# Patient Record
Sex: Female | Born: 1955 | Race: White | Hispanic: No | Marital: Married | Smoking: Never smoker
Health system: Southern US, Community
[De-identification: ages and names within clinical notes are randomized; demographics above are authoritative.]

## PROBLEM LIST (undated history)

## (undated) DIAGNOSIS — N939 Abnormal uterine and vaginal bleeding, unspecified: Secondary | ICD-10-CM

## (undated) DIAGNOSIS — D219 Benign neoplasm of connective and other soft tissue, unspecified: Secondary | ICD-10-CM

## (undated) DIAGNOSIS — M1990 Unspecified osteoarthritis, unspecified site: Secondary | ICD-10-CM

## (undated) DIAGNOSIS — K219 Gastro-esophageal reflux disease without esophagitis: Secondary | ICD-10-CM

## (undated) DIAGNOSIS — T7840XA Allergy, unspecified, initial encounter: Secondary | ICD-10-CM

## (undated) DIAGNOSIS — M8580 Other specified disorders of bone density and structure, unspecified site: Secondary | ICD-10-CM

## (undated) DIAGNOSIS — Z780 Asymptomatic menopausal state: Secondary | ICD-10-CM

## (undated) DIAGNOSIS — R002 Palpitations: Secondary | ICD-10-CM

## (undated) HISTORY — PX: WISDOM TOOTH EXTRACTION: SHX21

## (undated) HISTORY — DX: Asymptomatic menopausal state: Z78.0

## (undated) HISTORY — DX: Unspecified osteoarthritis, unspecified site: M19.90

## (undated) HISTORY — DX: Gastro-esophageal reflux disease without esophagitis: K21.9

## (undated) HISTORY — PX: AUGMENTATION MAMMAPLASTY: SUR837

## (undated) HISTORY — DX: Palpitations: R00.2

## (undated) HISTORY — DX: Other specified disorders of bone density and structure, unspecified site: M85.80

## (undated) HISTORY — DX: Abnormal uterine and vaginal bleeding, unspecified: N93.9

## (undated) HISTORY — PX: POLYPECTOMY: SHX149

## (undated) HISTORY — DX: Benign neoplasm of connective and other soft tissue, unspecified: D21.9

## (undated) HISTORY — PX: BREAST ENHANCEMENT SURGERY: SHX7

## (undated) HISTORY — DX: Allergy, unspecified, initial encounter: T78.40XA

---

## 2003-11-26 ENCOUNTER — Encounter: Admission: RE | Admit: 2003-11-26 | Discharge: 2003-11-26 | Payer: Self-pay | Admitting: Neurology

## 2005-04-03 ENCOUNTER — Other Ambulatory Visit: Admission: RE | Admit: 2005-04-03 | Discharge: 2005-04-03 | Payer: Self-pay | Admitting: Gynecology

## 2006-04-27 ENCOUNTER — Other Ambulatory Visit: Admission: RE | Admit: 2006-04-27 | Discharge: 2006-04-27 | Payer: Self-pay | Admitting: Gynecology

## 2008-12-22 ENCOUNTER — Ambulatory Visit: Payer: Self-pay | Admitting: Gastroenterology

## 2009-01-05 ENCOUNTER — Encounter: Payer: Self-pay | Admitting: Gastroenterology

## 2009-01-05 ENCOUNTER — Ambulatory Visit: Payer: Self-pay | Admitting: Gastroenterology

## 2009-01-09 ENCOUNTER — Encounter: Payer: Self-pay | Admitting: Gastroenterology

## 2009-07-09 ENCOUNTER — Encounter: Admission: RE | Admit: 2009-07-09 | Discharge: 2009-07-09 | Payer: Self-pay | Admitting: Gynecology

## 2010-10-02 ENCOUNTER — Encounter
Admission: RE | Admit: 2010-10-02 | Discharge: 2010-10-02 | Payer: Self-pay | Source: Home / Self Care | Attending: Gynecology | Admitting: Gynecology

## 2010-10-16 ENCOUNTER — Other Ambulatory Visit: Payer: Self-pay | Admitting: Gynecology

## 2011-05-06 ENCOUNTER — Other Ambulatory Visit: Payer: Self-pay | Admitting: Gynecology

## 2011-05-06 DIAGNOSIS — N63 Unspecified lump in unspecified breast: Secondary | ICD-10-CM

## 2011-05-09 ENCOUNTER — Ambulatory Visit
Admission: RE | Admit: 2011-05-09 | Discharge: 2011-05-09 | Disposition: A | Discharge status: Home / Self Care | Payer: Managed Care, Other (non HMO) | Source: Ambulatory Visit | Attending: Gynecology | Admitting: Gynecology

## 2011-05-09 ENCOUNTER — Other Ambulatory Visit: Payer: Self-pay | Admitting: Gynecology

## 2011-05-09 DIAGNOSIS — N63 Unspecified lump in unspecified breast: Secondary | ICD-10-CM

## 2011-10-29 ENCOUNTER — Other Ambulatory Visit: Payer: Self-pay | Admitting: Gynecology

## 2011-10-29 DIAGNOSIS — Z1231 Encounter for screening mammogram for malignant neoplasm of breast: Secondary | ICD-10-CM

## 2011-11-06 ENCOUNTER — Ambulatory Visit
Admission: RE | Admit: 2011-11-06 | Discharge: 2011-11-06 | Disposition: A | Discharge status: Home / Self Care | Payer: Managed Care, Other (non HMO) | Source: Ambulatory Visit | Attending: Gynecology | Admitting: Gynecology

## 2011-11-06 ENCOUNTER — Other Ambulatory Visit: Payer: Self-pay | Admitting: Gynecology

## 2011-11-06 DIAGNOSIS — Z1231 Encounter for screening mammogram for malignant neoplasm of breast: Secondary | ICD-10-CM

## 2013-05-25 ENCOUNTER — Other Ambulatory Visit: Payer: Self-pay

## 2013-05-25 DIAGNOSIS — Z1231 Encounter for screening mammogram for malignant neoplasm of breast: Secondary | ICD-10-CM

## 2013-05-25 DIAGNOSIS — Z9882 Breast implant status: Secondary | ICD-10-CM

## 2013-07-14 ENCOUNTER — Ambulatory Visit
Admission: RE | Admit: 2013-07-14 | Discharge: 2013-07-14 | Disposition: A | Discharge status: Home / Self Care | Payer: 59 | Source: Ambulatory Visit

## 2013-07-14 DIAGNOSIS — Z1231 Encounter for screening mammogram for malignant neoplasm of breast: Secondary | ICD-10-CM

## 2013-07-14 DIAGNOSIS — Z9882 Breast implant status: Secondary | ICD-10-CM

## 2013-07-18 ENCOUNTER — Other Ambulatory Visit: Payer: Self-pay | Admitting: Gynecology

## 2013-07-18 DIAGNOSIS — R928 Other abnormal and inconclusive findings on diagnostic imaging of breast: Secondary | ICD-10-CM

## 2013-07-22 ENCOUNTER — Ambulatory Visit
Admission: RE | Admit: 2013-07-22 | Discharge: 2013-07-22 | Disposition: A | Discharge status: Home / Self Care | Payer: 59 | Source: Ambulatory Visit | Attending: Gynecology | Admitting: Gynecology

## 2013-07-22 DIAGNOSIS — R928 Other abnormal and inconclusive findings on diagnostic imaging of breast: Secondary | ICD-10-CM

## 2013-10-26 ENCOUNTER — Encounter: Payer: Self-pay | Admitting: Gastroenterology

## 2014-01-11 ENCOUNTER — Other Ambulatory Visit: Payer: Self-pay | Admitting: Family Medicine

## 2014-01-11 DIAGNOSIS — R51 Headache: Principal | ICD-10-CM

## 2014-01-11 DIAGNOSIS — R519 Headache, unspecified: Secondary | ICD-10-CM

## 2014-01-12 ENCOUNTER — Ambulatory Visit
Admission: RE | Admit: 2014-01-12 | Discharge: 2014-01-12 | Disposition: A | Discharge status: Home / Self Care | Payer: 59 | Source: Ambulatory Visit | Attending: Family Medicine | Admitting: Family Medicine

## 2014-01-12 DIAGNOSIS — R51 Headache: Principal | ICD-10-CM

## 2014-01-12 DIAGNOSIS — R519 Headache, unspecified: Secondary | ICD-10-CM

## 2014-05-30 ENCOUNTER — Encounter: Payer: Self-pay | Admitting: Gastroenterology

## 2014-08-17 ENCOUNTER — Encounter: Payer: Self-pay | Admitting: Gastroenterology

## 2014-09-08 HISTORY — PX: COLONOSCOPY: SHX174

## 2014-09-25 ENCOUNTER — Ambulatory Visit (AMBULATORY_SURGERY_CENTER): Payer: Self-pay

## 2014-09-25 VITALS — Ht 65.0 in | Wt 123.0 lb

## 2014-09-25 DIAGNOSIS — Z860100 Personal history of colon polyps, unspecified: Secondary | ICD-10-CM

## 2014-09-25 MED ORDER — MOVIPREP 100 G PO SOLR: 1.0000 | Freq: Once | ORAL | Status: DC

## 2014-09-25 NOTE — Progress Notes (Signed)
No allergies to eggs or soy No problems with anesthesia No home oxygen No diet/weight loss meds  Has email  Emmi instructions given for colonoscopy

## 2014-10-13 ENCOUNTER — Encounter: Payer: Self-pay | Admitting: Gastroenterology

## 2014-10-13 ENCOUNTER — Ambulatory Visit (AMBULATORY_SURGERY_CENTER): Payer: 59 | Admitting: Gastroenterology

## 2014-10-13 VITALS — BP 110/83 | HR 74 | Temp 96.0°F | Resp 23 | Ht 65.0 in | Wt 123.0 lb

## 2014-10-13 DIAGNOSIS — Z86010 Personal history of colonic polyps: Secondary | ICD-10-CM

## 2014-10-13 DIAGNOSIS — Z800 Family history of malignant neoplasm of digestive organs: Secondary | ICD-10-CM

## 2014-10-13 MED ORDER — SODIUM CHLORIDE 0.9 % IV SOLN: 500.0000 mL | INTRAVENOUS | Status: DC

## 2014-10-13 NOTE — Patient Instructions (Signed)
YOU HAD AN ENDOSCOPIC PROCEDURE TODAY AT THE [facility]: Refer to the procedure report that was given to you for any specific questions about what was found during the examination.  If the procedure report does not answer your questions, please call your gastroenterologist to clarify.  If you requested that your care partner not be given the details of your procedure findings, then the procedure report has been included in a sealed envelope for you to review at your convenience later.  YOU SHOULD EXPECT: Some feelings of bloating in the abdomen. Passage of more gas than usual.  Walking can help get rid of the air that was put into your GI tract during the procedure and reduce the bloating. If you had a lower endoscopy (such as a colonoscopy or flexible sigmoidoscopy) you may notice spotting of blood in your stool or on the toilet paper. If you underwent a bowel prep for your procedure, then you may not have a normal bowel movement for a few days.  DIET: Your first meal following the procedure should be a light meal and then it is ok to progress to your normal diet.  A half-sandwich or bowl of soup is an example of a good first meal.  Heavy or fried foods are harder to digest and may make you feel nauseous or bloated.  Likewise meals heavy in dairy and vegetables can cause extra gas to form and this can also increase the bloating.  Drink plenty of fluids but you should avoid alcoholic beverages for 24 hours.  ACTIVITY: Your care partner should take you home directly after the procedure.  You should plan to take it easy, moving slowly for the rest of the day.  You can resume normal activity the day after the procedure however you should NOT DRIVE or use heavy machinery for 24 hours (because of the sedation medicines used during the test).    SYMPTOMS TO REPORT IMMEDIATELY: A gastroenterologist can be reached at any hour.  During normal business hours, 8:30 AM to 5:00 PM Monday through Friday,  call [phone].  After hours and on weekends, please call the GI answering service at [phone] who will take a message and have the physician on call contact you.   Following lower endoscopy (colonoscopy or flexible sigmoidoscopy):  Excessive amounts of blood in the stool  Significant tenderness or worsening of abdominal pains  Swelling of the abdomen that is new, acute  Fever of 100F or higher  FOLLOW UP: If any biopsies were taken you will be contacted by phone or by letter within the next 1-3 weeks.  Call your gastroenterologist if you have not heard about the biopsies in 3 weeks.  Our staff will call the home number listed on your records the next business day following your procedure to check on you and address any questions or concerns that you may have at that time regarding the information given to you following your procedure. This is a courtesy call and so if there is no answer at the home number and we have not heard from you through the emergency physician on call, we will assume that you have returned to your regular daily activities without incident.  SIGNATURES/CONFIDENTIALITY: You and/or your care partner have signed paperwork which will be entered into your electronic medical record.  These signatures attest to the fact that that the information above on your After Visit Summary has been reviewed and is understood.  Full responsibility of the confidentiality of this   discharge information lies with you and/or your care-partner.     Handouts were given to your care partner on diverticulosis and a high fiber diet with liberal fluid intake. You might notice some irritation in your nose or drainage.  This may cause feelings of congestion.  This is from the oxygen, which can be drying.  This is no cause for concern; this should clear up in a few days.  You may resume your current medications today. Please call if any questions or concerns.   

## 2014-10-13 NOTE — Op Note (Signed)
[facility]  [address] [place], [id number]   COLONOSCOPY PROCEDURE REPORT  PATIENT: Doris Braun, Doris Braun  MR#: [id number] BIRTHDATE: Jun 24, 1956 , 32  yrs. old GENDER: female ENDOSCOPIST: Ladene Artist, MD, [facility] PROCEDURE DATE:  10/13/2014 PROCEDURE:   Colonoscopy, surveillance First Screening Colonoscopy - Avg.  risk and is 50 yrs.  old or older - No.  Prior Negative Screening - Now for repeat screening. N/A  History of Adenoma - Now for follow-up colonoscopy & has been > or = to 3 yrs.  Yes hx of adenoma.  Has been 3 or more years since last colonoscopy.  Polyps Removed Today? No.  Polyps Removed Today? No.  Recommend repeat exam, <10 yrs? Polyps Removed Today? No.  Recommend repeat exam, <10 yrs? Yes.  Polyps Removed Today? No.  Recommend repeat exam, <10 yrs? Yes.  High risk (family or personal hx). ASA CLASS:   Class II INDICATIONS:surveillance colonoscopy based on a history of adenomatous colonic polyp(s), patient's immediate family history of colon cancer, and patient's family history of colon cancer, distant relatives. MEDICATIONS: Monitored anesthesia care, Propofol 400 mg IV, and lidocaine 40 mg IV DESCRIPTION OF PROCEDURE:   After the risks benefits and alternatives of the procedure were thoroughly explained, informed consent was obtained.  The digital rectal exam revealed no abnormalities of the rectum.   The LB [id number]  endoscope was introduced through the anus and advanced to the cecum, which was identified by both the appendix and ileocecal valve. No adverse events experienced.   The quality of the prep was excellent, using MoviPrep  The instrument was then slowly withdrawn as the colon was fully examined.    COLON FINDINGS: There was mild diverticulosis noted in the sigmoid colon.   The examination was otherwise normal.  Retroflexed views revealed no abnormalities. The time to cecum=2 minutes 09 seconds. Withdrawal time=9 minutes 30  seconds.  The scope was withdrawn and the procedure completed. COMPLICATIONS: There were no immediate complications.  ENDOSCOPIC IMPRESSION: 1.   Mild diverticulosis was noted in the sigmoid colon 2.   The examination was otherwise normal  RECOMMENDATIONS: 1.  High fiber diet with liberal fluid intake. 2.  Repeat Colonoscopy in 5 years.  eSigned:  Ladene Artist, MD, [facility] 10/13/2014 8:28 AM

## 2014-10-13 NOTE — Progress Notes (Signed)
No problems noted in the recovery room. maw 

## 2014-10-13 NOTE — Progress Notes (Signed)
Stable to RR 

## 2014-10-16 ENCOUNTER — Telehealth: Payer: Self-pay | Admitting: *Deleted

## 2014-10-16 NOTE — Telephone Encounter (Signed)
Message left

## 2014-10-26 ENCOUNTER — Other Ambulatory Visit: Payer: Self-pay | Admitting: Family Medicine

## 2014-10-26 DIAGNOSIS — R1011 Right upper quadrant pain: Secondary | ICD-10-CM

## 2014-10-26 DIAGNOSIS — R109 Unspecified abdominal pain: Secondary | ICD-10-CM

## 2014-10-31 ENCOUNTER — Ambulatory Visit
Admission: RE | Admit: 2014-10-31 | Discharge: 2014-10-31 | Disposition: A | Discharge status: Home / Self Care | Payer: 59 | Source: Ambulatory Visit | Attending: Family Medicine | Admitting: Family Medicine

## 2014-10-31 ENCOUNTER — Other Ambulatory Visit: Payer: 59

## 2014-10-31 DIAGNOSIS — R10A0 Flank pain, unspecified side: Secondary | ICD-10-CM

## 2014-10-31 DIAGNOSIS — R1011 Right upper quadrant pain: Secondary | ICD-10-CM

## 2014-10-31 DIAGNOSIS — R109 Unspecified abdominal pain: Secondary | ICD-10-CM

## 2015-04-26 ENCOUNTER — Other Ambulatory Visit: Payer: Self-pay | Admitting: Gynecology

## 2015-04-26 DIAGNOSIS — N6320 Unspecified lump in the left breast, unspecified quadrant: Secondary | ICD-10-CM

## 2015-04-30 ENCOUNTER — Telehealth: Payer: Self-pay | Admitting: Gastroenterology

## 2015-04-30 NOTE — Telephone Encounter (Signed)
Patient with a 10 day history of RUQ pain and bloating.  She will come in and see Arta Bruce, PA on 05/02/15 8:45

## 2015-05-02 ENCOUNTER — Other Ambulatory Visit (INDEPENDENT_AMBULATORY_CARE_PROVIDER_SITE_OTHER): Payer: Managed Care, Other (non HMO)

## 2015-05-02 ENCOUNTER — Encounter: Payer: Self-pay | Admitting: Physician Assistant

## 2015-05-02 ENCOUNTER — Ambulatory Visit (INDEPENDENT_AMBULATORY_CARE_PROVIDER_SITE_OTHER): Payer: Managed Care, Other (non HMO) | Admitting: Physician Assistant

## 2015-05-02 VITALS — BP 98/62 | HR 80 | Ht 65.0 in | Wt 123.6 lb

## 2015-05-02 DIAGNOSIS — R110 Nausea: Secondary | ICD-10-CM | POA: Diagnosis not present

## 2015-05-02 DIAGNOSIS — R1011 Right upper quadrant pain: Secondary | ICD-10-CM | POA: Diagnosis not present

## 2015-05-02 LAB — CBC WITH DIFFERENTIAL/PLATELET
BASOS PCT: 0.4 % (ref 0.0–3.0)
Basophils Absolute: 0 10*3/uL (ref 0.0–0.1)
EOS PCT: 1.3 % (ref 0.0–5.0)
Eosinophils Absolute: 0.1 10*3/uL (ref 0.0–0.7)
HCT: 42.1 % (ref 36.0–46.0)
Hemoglobin: 14.1 g/dL (ref 12.0–15.0)
LYMPHS ABS: 1.6 10*3/uL (ref 0.7–4.0)
Lymphocytes Relative: 19.3 % (ref 12.0–46.0)
MCHC: 33.6 g/dL (ref 30.0–36.0)
MCV: 87.7 fl (ref 78.0–100.0)
Monocytes Absolute: 0.9 10*3/uL (ref 0.1–1.0)
Monocytes Relative: 11 % (ref 3.0–12.0)
NEUTROS PCT: 68 % (ref 43.0–77.0)
Neutro Abs: 5.7 10*3/uL (ref 1.4–7.7)
Platelets: 321 10*3/uL (ref 150.0–400.0)
RBC: 4.8 Mil/uL (ref 3.87–5.11)
RDW: 13.9 % (ref 11.5–15.5)
WBC: 8.4 10*3/uL (ref 4.0–10.5)

## 2015-05-02 LAB — HEPATIC FUNCTION PANEL
ALBUMIN: 4.1 g/dL (ref 3.5–5.2)
ALT: 12 U/L (ref 0–35)
AST: 17 U/L (ref 0–37)
Alkaline Phosphatase: 59 U/L (ref 39–117)
Bilirubin, Direct: 0.2 mg/dL (ref 0.0–0.3)
TOTAL PROTEIN: 7.5 g/dL (ref 6.0–8.3)
Total Bilirubin: 1.4 mg/dL — ABNORMAL HIGH (ref 0.2–1.2)

## 2015-05-02 LAB — GAMMA GT: GGT: 11 U/L (ref 7–51)

## 2015-05-02 LAB — LIPASE: LIPASE: 31 U/L (ref 11.0–59.0)

## 2015-05-02 MED ORDER — ONDANSETRON HCL 4 MG PO TABS: 4.0000 mg | ORAL_TABLET | Freq: Three times a day (TID) | ORAL | Status: DC | PRN

## 2015-05-02 MED ORDER — PANTOPRAZOLE SODIUM 40 MG PO TBEC: DELAYED_RELEASE_TABLET | ORAL | Status: DC

## 2015-05-02 NOTE — Patient Instructions (Signed)
We have sent the following medications to your pharmacy for you to pick up at your convenience: Zofran 4mg , Pantoprazole 40mg .  Your physician has requested that you go to the basement for lab work before leaving today.     You have been scheduled for a HIDA scan at [facility] (1st floor) on 05/16/2015. Please arrive 15 minutes prior to your scheduled appointment at  2:02RK. Make certain not to have anything to eat or drink at least 6 hours prior to your test. Should this appointment date or time not work well for you, please call radiology scheduling at [phone].  _____________________________________________________________________ hepatobiliary (HIDA) scan is an imaging procedure used to diagnose problems in the liver, gallbladder and bile ducts. In the HIDA scan, a radioactive chemical or tracer is injected into a vein in your arm. The tracer is handled by the liver like bile. Bile is a fluid produced and excreted by your liver that helps your digestive system break down fats in the foods you eat. Bile is stored in your gallbladder and the gallbladder releases the bile when you eat a meal. A special nuclear medicine scanner (gamma camera) tracks the flow of the tracer from your liver into your gallbladder and small intestine.  During your HIDA scan  You'll be asked to change into a hospital gown before your HIDA scan begins. Your health care team will position you on a table, usually on your back. The radioactive tracer is then injected into a vein in your arm.The tracer travels through your bloodstream to your liver, where it's taken up by the bile-producing cells. The radioactive tracer travels with the bile from your liver into your gallbladder and through your bile ducts to your small intestine.You may feel some pressure while the radioactive tracer is injected into your vein. As you lie on the table, a special gamma camera is positioned over your abdomen taking pictures of the tracer  as it moves through your body. The gamma camera takes pictures continually for about an hour. You'll need to keep still during the HIDA scan. This can become uncomfortable, but you may find that you can lessen the discomfort by taking deep breaths and thinking about other things. Tell your health care team if you're uncomfortable. The radiologist will watch on a computer the progress of the radioactive tracer through your body. The HIDA scan may be stopped when the radioactive tracer is seen in the gallbladder and enters your small intestine. This typically takes about an hour. In some cases extra imaging will be performed if original images aren't satisfactory, if morphine is given to help visualize the gallbladder or if the medication CCK is given to look at the contraction of the gallbladder. This test typically takes 2 hours to complete. ________________________________________________________________________

## 2015-05-02 NOTE — Progress Notes (Signed)
Patient ID: Doris Braun, female   DOB: September 26, 1955, 59 y.o.   MRN: [id number]     History of Present Illness: Doris Braun is a delightful 59 year old female who is known to Dr. Fuller Plan from previous colonoscopy. Her most recent colonoscopy was on 10/13/2014 at which time she was noted to have mild diverticulosis in the sigmoid, the exam was otherwise normal. She was advised to have surveillance colonoscopy in 5 years.  Doris Braun presents today with complaints of intermittent right upper quadrant pain since February. She reports that she has had several episodes of epigastric and right upper quadrant pain that occur after meals and are associated with nausea. 2 weeks ago she was vacationing in [place] and after going out to dinner developed severe epigastric and right upper quadrant pain. She describes it as feeling as if a tight band was around her ribs with pain radiating through her right side to just below her right shoulder blade. She has been nauseous in waves and has been belching a lot. She has had no diarrhea. She denies fever or chills. In February at the onset of this discomfort, she was sent for an abdominal ultrasound that was normal. She states her identical twin sister was recently found to have gallstones and had to have a cholecystectomy. Her mother had gallbladder disease resulting in cholecystectomy as did her maternal grandmother.   Past Medical History  Diagnosis Date  . Postmenopausal     Past Surgical History  Procedure Laterality Date  . Breast enhancement surgery      1996   Family History  Problem Relation Age of Onset  . Colon cancer Father   . Colon cancer Maternal Grandfather   . Colon cancer Paternal Grandmother   . Colon cancer Paternal Grandfather    Social History  Substance Use Topics  . Smoking status: Never Smoker   . Smokeless tobacco: Never Used  . Alcohol Use: No   Current Outpatient Prescriptions  Medication Sig Dispense Refill  . estradiol  (VIVELLE-DOT) 0.075 MG/24HR Place 1 patch onto the skin 2 (two) times a week.    . progesterone (PROMETRIUM) 100 MG capsule Take 100 mg by mouth daily. Every month 14 days    . ondansetron (ZOFRAN) 4 MG tablet Take 1 tablet (4 mg total) by mouth every 8 (eight) hours as needed for nausea or vomiting. 30 tablet 0  . pantoprazole (PROTONIX) 40 MG tablet Take 1 every morning 30 minutes prior to breakfast. 30 tablet 2   No current facility-administered medications for this visit.   No Known Allergies   Review of Systems: Gen: Denies any fever, chills, sweats, anorexia, fatigue, weakness, malaise, weight loss, and sleep disorder CV: Denies chest pain, angina, palpitations, syncope, orthopnea, PND, peripheral edema, and claudication. Resp: Denies dyspnea at rest, dyspnea with exercise, cough, sputum, wheezing, coughing up blood, and pleurisy. GI: Denies vomiting blood, jaundice, and fecal incontinence.   Denies dysphagia or odynophagia. GU : Denies urinary burning, blood in urine, urinary frequency, urinary hesitancy, nocturnal urination, and urinary incontinence. MS: Denies joint pain, limitation of movement, and swelling, stiffness, low back pain, extremity pain. Denies muscle weakness, cramps, atrophy.  Derm: Denies rash, itching, dry skin, hives, moles, warts, or unhealing ulcers.  Psych: Denies depression, anxiety, memory loss, suicidal ideation, hallucinations, paranoia, and confusion. Heme: Denies bruising, bleeding, and enlarged lymph nodes. Neuro:  Denies any headaches, dizziness, paresthesia Endo:  Denies any problems with DM, thyroid, adrenal   Studies:  Result     CLINICAL  DATA: Right upper quadrant and right flank pain.  EXAM: ULTRASOUND ABDOMEN COMPLETE  COMPARISON: None.  FINDINGS: Gallbladder: No gallstones or wall thickening visualized. No sonographic Murphy sign noted.  Common bile duct: Diameter: 4 mm  Liver: No focal lesion identified. Within normal  limits in parenchymal echogenicity.  IVC: No abnormality visualized.  Pancreas: Visualized portion unremarkable.  Spleen: Size and appearance within normal limits.  Right Kidney: Length: 9.2 cm. Echogenicity within normal limits. No mass or hydronephrosis visualized.  Left Kidney: Length: 8.9 cm. Echogenicity within normal limits. No mass or hydronephrosis visualized.  Abdominal aorta: No aneurysm visualized.  Other findings: None.  IMPRESSION: Unremarkable abdominal ultrasound.   Electronically Signed  By: [name]  On: 10/31/2014 18:54       Physical Exam: BP 98/62 mmHg  Pulse 80  Ht 5\' 5"  (1.651 m)  Wt 123 lb 9.6 oz (56.065 kg)  BMI 20.57 kg/m2  LMP 05/09/2011 General: Pleasant, well developed ,female in no acute distress Head: Normocephalic and atraumatic Eyes:  sclerae anicteric, conjunctiva pink  Ears: Normal auditory acuity Lungs: Clear throughout to auscultation Heart: Regular rate and rhythm Abdomen: Soft, non distended, mild epigastric and right upper quadrant tenderness to palpation, No masses, no hepatomegaly. Normal bowel sounds Musculoskeletal: Symmetrical with no gross deformities  Extremities: No edema  Neurological: Alert oriented x 4, grossly nonfocal Psychological:  Alert and cooperative. Normal mood and affect  Assessment and Recommendations: 59 year old female status post several episodes of postprandial right upper quadrant pain radiating to the scapula associated with nausea, here for evaluation. Symptoms are suggestive of biliary colic, however recent ultrasound was nonrevealing. Patient will be sent for a CBC, hepatic function panel, lipase, and GGTP. She will be scheduled for a HIDA scan. She will be given a trial of pantoprazole 40 mg 1 by mouth 30 minutes prior to breakfast, along with Zofran 4 mg 1 by mouth every 8 hours when necessary nausea. She's been instructed to avoid fatty foods. Further recommendations will be  made pending the findings of the above.        [name], [name] 05/02/2015,

## 2015-05-02 NOTE — Progress Notes (Signed)
Reviewed and agree with management plan.  Nolita Kutter T. Daryle Boyington, MD FACG 

## 2015-05-03 ENCOUNTER — Encounter: Payer: Self-pay | Admitting: Gastroenterology

## 2015-05-16 ENCOUNTER — Ambulatory Visit (HOSPITAL_COMMUNITY): Payer: Managed Care, Other (non HMO)

## 2015-05-23 ENCOUNTER — Ambulatory Visit (HOSPITAL_COMMUNITY)
Admission: RE | Admit: 2015-05-23 | Discharge: 2015-05-23 | Disposition: A | Discharge status: Home / Self Care | Payer: Managed Care, Other (non HMO) | Source: Ambulatory Visit | Attending: Physician Assistant | Admitting: Physician Assistant

## 2015-05-23 ENCOUNTER — Other Ambulatory Visit: Payer: 59

## 2015-05-23 DIAGNOSIS — R110 Nausea: Secondary | ICD-10-CM | POA: Diagnosis not present

## 2015-05-23 DIAGNOSIS — R1011 Right upper quadrant pain: Secondary | ICD-10-CM | POA: Insufficient documentation

## 2015-05-23 MED ORDER — TECHNETIUM TC 99M MEBROFENIN IV KIT
5.5000 | PACK | Freq: Once | INTRAVENOUS | Status: DC | PRN
  Administered 2015-05-23: 6 via INTRAVENOUS
  Filled 2015-05-23: qty 6

## 2015-05-23 MED ORDER — SINCALIDE 5 MCG IJ SOLR: 0.0200 ug/kg | Freq: Once | INTRAMUSCULAR | Status: DC

## 2015-05-25 ENCOUNTER — Ambulatory Visit
Admission: RE | Admit: 2015-05-25 | Discharge: 2015-05-25 | Disposition: A | Discharge status: Home / Self Care | Payer: Managed Care, Other (non HMO) | Source: Ambulatory Visit | Attending: Gynecology | Admitting: Gynecology

## 2015-05-25 ENCOUNTER — Other Ambulatory Visit: Payer: Self-pay | Admitting: Gynecology

## 2015-05-25 DIAGNOSIS — N6320 Unspecified lump in the left breast, unspecified quadrant: Secondary | ICD-10-CM

## 2015-06-20 ENCOUNTER — Encounter: Payer: Self-pay | Admitting: Physician Assistant

## 2015-06-20 ENCOUNTER — Ambulatory Visit (INDEPENDENT_AMBULATORY_CARE_PROVIDER_SITE_OTHER): Payer: Managed Care, Other (non HMO) | Admitting: Physician Assistant

## 2015-06-20 VITALS — BP 104/70 | HR 72 | Ht 65.0 in | Wt 126.2 lb

## 2015-06-20 DIAGNOSIS — R1013 Epigastric pain: Secondary | ICD-10-CM

## 2015-06-20 DIAGNOSIS — G8929 Other chronic pain: Secondary | ICD-10-CM

## 2015-06-20 DIAGNOSIS — K219 Gastro-esophageal reflux disease without esophagitis: Secondary | ICD-10-CM | POA: Diagnosis not present

## 2015-06-20 MED ORDER — PANTOPRAZOLE SODIUM 40 MG PO TBEC: DELAYED_RELEASE_TABLET | ORAL | Status: DC

## 2015-06-20 NOTE — Progress Notes (Signed)
Patient ID: Doris Braun, female   DOB: 03-22-1956, 59 y.o.   MRN: [id number]     History of Present Illness: Doris Braun is a pleasant 59 year old female known to Dr. Fuller Plan. Colonoscopy in February 2006 revealed sigmoid diverticulosis but was otherwise a normal exam. She was evaluated on August 24 with intermittent right upper quadrant pain that has been occurring since February. Her pain was associated with nausea. Abdominal ultrasound in February 2016 was unremarkable with no gallstones or wall thickening visualized. The common bile duct was normal. Hepatic function panel was unremarkable. At her last visit she was given a trial of pantoprazole and she was sent for a hiatus scan. The hiatus scan was normal. She notes that she was feeling much better on pantoprazole so she discontinued it and after several days had recurrent nausea. She also reports that she has been having breakthrough vaginal bleeding and has recently been on estrogen and progesterone on different days of the month. She notes when she takes the progesterone, she tends to feel nauseous. When she was off progesterone she did not feel nauseous. She recently was noted to have a thickened endometrium, and is scheduled to have an endometrial biopsy on November 2. Has not had any right upper quadrant pain or pain to the right scapula. Her discomfort and nausea recently have been more noticeable in the morning on an empty stomach. She has had no bright red blood per rectum or melena.   Past Medical History  Diagnosis Date  . Postmenopausal   . Abnormal vaginal bleeding     Past Surgical History  Procedure Laterality Date  . Breast enhancement surgery      1996   Family History  Problem Relation Age of Onset  . Colon cancer Father   . Colon cancer Maternal Grandfather   . Colon cancer Paternal Grandmother   . Colon cancer Paternal Grandfather    Social History  Substance Use Topics  . Smoking status: Never Smoker   . Smokeless  tobacco: Never Used  . Alcohol Use: No   Current Outpatient Prescriptions  Medication Sig Dispense Refill  . estradiol (VIVELLE-DOT) 0.075 MG/24HR Place 1 patch onto the skin 2 (two) times a week.    . Multiple Vitamin (MULTIVITAMIN) tablet Take 1 tablet by mouth daily.    . ondansetron (ZOFRAN) 4 MG tablet Take 1 tablet (4 mg total) by mouth every 8 (eight) hours as needed for nausea or vomiting. 30 tablet 0  . pantoprazole (PROTONIX) 40 MG tablet Take 1 every morning 30 minutes prior to breakfast. 30 tablet 2  . progesterone (PROMETRIUM) 100 MG capsule Take 100 mg by mouth daily. Every month 14 days     No current facility-administered medications for this visit.   No Known Allergies   Review of Systems: Gen: Denies any fever, chills, sweats, anorexia, fatigue, weakness, malaise, weight loss, and sleep disorder CV: Denies chest pain, angina, palpitations, syncope, orthopnea, PND, peripheral edema, and claudication. Resp: Denies dyspnea at rest, dyspnea with exercise, cough, sputum, wheezing, coughing up blood, and pleurisy. GI: Denies vomiting blood, jaundice, and fecal incontinence.   Denies dysphagia or odynophagia. GU : Denies urinary burning, blood in urine, urinary frequency, urinary hesitancy, nocturnal urination, and urinary incontinence. MS: Denies joint pain, limitation of movement, and swelling, stiffness, low back pain, extremity pain. Denies muscle weakness, cramps, atrophy.  Derm: Denies rash, itching, dry skin, hives, moles, warts, or unhealing ulcers.  Psych: Denies depression, anxiety, memory loss, suicidal ideation, hallucinations, paranoia, and  confusion. Heme: Denies bruising, bleeding, and enlarged lymph nodes. Neuro:  Denies any headaches, dizziness, paresthesia Endo:  Denies any problems with DM, thyroid, adrenal  Labs: Blood work from 05/02/2015 showed a total bili 1.4, direct bili 0.2, alkaline phosphatase 59, AST 17, ALT 12. GGT 11, lipase 31. Studies:    Nm Hepato W/eject Fract  05/23/2015  CLINICAL DATA:  Right upper quadrant pain and nausea EXAM: NUCLEAR MEDICINE HEPATOBILIARY IMAGING WITH GALLBLADDER EF TECHNIQUE: Sequential images of the abdomen were obtained out to 60 minutes following intravenous administration of radiopharmaceutical. After slow intravenous infusion of 1.1 micrograms Cholecystokinin, gallbladder ejection fraction was determined. RADIOPHARMACEUTICALS:  5.5 mCi Tc-56m Choletec IV COMPARISON:  Ultrasound of the abdomen of 10/31/2014 FINDINGS: The patient was injected with 5.5 mCi of technetium 11 M Choletec intravenously and imaging over the upper abdomen was performed. The radionuclide appears throughout the liver. There is excretion into the intrahepatic ductal system with visualization of the common bile duct, gallbladder, and small bowel. This represents a normal nuclear medicine hepatobiliary scan. The patient was then given 1.1 mcg of CCK and imaging over the gallbladder was performed. There was no pain experienced by the patient urine infusion of CCK. The gallbladder ejection fraction is measured at 57% at 45 minutes which is within normal limits. At 45 min, normal ejection fraction is greater than 40%. IMPRESSION: 1. Normal nuclear medicine hepatobiliary scan. 2. Normal gallbladder ejection fraction of 57%. Electronically Signed   By: Ivar Drape M.D.   On: 05/23/2015 10:48   US Breast Ltd Uni Left Inc Axilla  05/25/2015  CLINICAL DATA:  Follow-up for a probably benign left breast mass. EXAM: DIGITAL DIAGNOSTIC BILATERAL MAMMOGRAM WITH IMPLANTS, 3D TOMOSYNTHESIS WITH CAD LEFT BREAST ULTRASOUND The patient has bilateral subpectoral saline implants. Standard and implant displaced views were performed. COMPARISON:  Previous exam(s). ACR Breast Density Category d: The breast tissue is extremely dense, which lowers the sensitivity of mammography. FINDINGS: No suspicious masses or calcifications are seen in either breast. The probably  benign oval mass in the upper-outer left breast measuring approximately 1.1 cm appears unchanged. There is no mammographic evidence of malignancy in either breast. Mammographic images were processed with CAD. Physical examination of the upper-outer left breast does not reveal any palpable masses. Targeted ultrasound of the left breast was performed demonstrating an oval well-circumscribed near anechoic mass with posterior acoustic enhancement at 1 o'clock 3 cm from nipple compatible with a cyst (likely septated cyst) measuring 0.5 x 0.3 x 0.4 cm. This is similar when compared to the prior exam dated 07/22/2013. Several additional cysts/clusters of cysts are seen in the upper-outer left breast. IMPRESSION: 1. Left breast cyst. 2. No mammographic evidence of malignancy in either breast. RECOMMENDATION: Screening mammogram in one year.(Code:SM-B-01Y) I have discussed the findings and recommendations with the patient. Results were also provided in writing at the conclusion of the visit. If applicable, a reminder letter will be sent to the patient regarding the next appointment. BI-RADS CATEGORY  2: Benign. Electronically Signed   By: Everlean Alstrom M.D.   On: 05/25/2015 11:02   Mm Diag Breast W/implant Tomo Bilateral  05/25/2015  CLINICAL DATA:  Follow-up for a probably benign left breast mass. EXAM: DIGITAL DIAGNOSTIC BILATERAL MAMMOGRAM WITH IMPLANTS, 3D TOMOSYNTHESIS WITH CAD LEFT BREAST ULTRASOUND The patient has bilateral subpectoral saline implants. Standard and implant displaced views were performed. COMPARISON:  Previous exam(s). ACR Breast Density Category d: The breast tissue is extremely dense, which lowers the sensitivity of mammography. FINDINGS:  No suspicious masses or calcifications are seen in either breast. The probably benign oval mass in the upper-outer left breast measuring approximately 1.1 cm appears unchanged. There is no mammographic evidence of malignancy in either breast. Mammographic  images were processed with CAD. Physical examination of the upper-outer left breast does not reveal any palpable masses. Targeted ultrasound of the left breast was performed demonstrating an oval well-circumscribed near anechoic mass with posterior acoustic enhancement at 1 o'clock 3 cm from nipple compatible with a cyst (likely septated cyst) measuring 0.5 x 0.3 x 0.4 cm. This is similar when compared to the prior exam dated 07/22/2013. Several additional cysts/clusters of cysts are seen in the upper-outer left breast. IMPRESSION: 1. Left breast cyst. 2. No mammographic evidence of malignancy in either breast. RECOMMENDATION: Screening mammogram in one year.(Code:SM-B-01Y) I have discussed the findings and recommendations with the patient. Results were also provided in writing at the conclusion of the visit. If applicable, a reminder letter will be sent to the patient regarding the next appointment. BI-RADS CATEGORY  2: Benign. Electronically Signed   By: Everlean Alstrom M.D.   On: 05/25/2015 11:02     Physical Exam: BP 104/70 mmHg  Pulse 72  Ht 5\' 5"  (1.651 m)  Wt 126 lb 4 oz (57.267 kg)  BMI 21.01 kg/m2  LMP 05/09/2011 General: Pleasant, well developed , Caucasian female in no acute distress Head: Normocephalic and atraumatic Eyes:  sclerae anicteric, conjunctiva pink  Ears: Normal auditory acuity Lungs: Clear throughout to auscultation Heart: Regular rate and rhythm Abdomen: Soft, non distended, non-tender. No masses, no hepatomegaly. Normal bowel sounds Musculoskeletal: Symmetrical with no gross deformities  Extremities: No edema  Neurological: Alert oriented x 4, grossly nonfocal Psychological:  Alert and cooperative. Normal mood and affect  Assessment and Recommendations: 59 year old female with several month history of epigastric and right upper quadrant pain associated with nausea here for follow-up. Abdominal ultrasound, hepatic function panel, and hiatus scan have been  nonrevealing. Patient has noted significant relief of her symptoms with daily use of pantoprazole 40 mg by mouth every morning 30 minutes prior to breakfast. She has reported that she seems to feel more nauseous on the days that she takes her progesterone. She is followed by Lesia Hausen, nurse practitioner, at [facility] and says she plans to speak to her today about possibly decreasing the dose of her progesterone. Tequila will follow-up here in early December, but has been instructed to contact us immediately if her symptoms become worse, at which time she may be a candidate for an EGD.        Jorrell Kuster, Vita Barley PA-C 06/20/2015,   CC:Eve KEY, NP, [place]

## 2015-06-20 NOTE — Patient Instructions (Addendum)
Food Choices for Gastroesophageal Reflux Disease, Adult When you have gastroesophageal reflux disease (GERD), the foods you eat and your eating habits are very important. Choosing the right foods can help ease the discomfort of GERD. WHAT GENERAL GUIDELINES DO I NEED TO FOLLOW?  Choose fruits, vegetables, whole grains, low-fat dairy products, and low-fat meat, fish, and poultry.  Limit fats such as oils, salad dressings, butter, nuts, and avocado.  Keep a food diary to identify foods that cause symptoms.  Avoid foods that cause reflux. These may be different for different people.  Eat frequent small meals instead of three large meals each day.  Eat your meals slowly, in a relaxed setting.  Limit fried foods.  Cook foods using methods other than frying.  Avoid drinking alcohol.  Avoid drinking large amounts of liquids with your meals.  Avoid bending over or lying down until 2-3 hours after eating. WHAT FOODS ARE NOT RECOMMENDED? The following are some foods and drinks that may worsen your symptoms: Vegetables Tomatoes. Tomato juice. Tomato and spaghetti sauce. Chili peppers. Onion and garlic. Horseradish. Fruits Oranges, grapefruit, and lemon (fruit and juice). Meats High-fat meats, fish, and poultry. This includes hot dogs, ribs, ham, sausage, salami, and bacon. Dairy Whole milk and chocolate milk. Sour cream. Cream. Butter. Ice cream. Cream cheese.  Beverages Coffee and tea, with or without caffeine. Carbonated beverages or energy drinks. Condiments Hot sauce. Barbecue sauce.  Sweets/Desserts Chocolate and cocoa. Donuts. Peppermint and spearmint. Fats and Oils High-fat foods, including [place] fries and potato chips. Other Vinegar. Strong spices, such as black pepper, white pepper, red pepper, cayenne, curry powder, cloves, ginger, and chili powder. The items listed above may not be a complete list of foods and beverages to avoid. Contact your dietitian for more  information.   This information is not intended to replace advice given to you by your health care provider. Make sure you discuss any questions you have with your health care provider.   Document Released: 08/25/2005 Document Revised: 09/15/2014 Document Reviewed: 06/29/2013 Elsevier Interactive Patient Education [employer].   We have sent the following medications to your pharmacy for you to pick up at your convenience: Pantoprazole  Please follow up with Cecille Rubin Hvozdovic, PA-C in early December

## 2015-06-20 NOTE — Progress Notes (Signed)
Reviewed and agree with management plan.  Malcolm T. Stark, MD FACG 

## 2015-06-26 ENCOUNTER — Encounter: Payer: Self-pay | Admitting: Physician Assistant

## 2016-02-06 ENCOUNTER — Other Ambulatory Visit: Payer: Self-pay

## 2016-02-06 MED ORDER — PANTOPRAZOLE SODIUM 40 MG PO TBEC: DELAYED_RELEASE_TABLET | ORAL | Status: DC

## 2016-07-18 ENCOUNTER — Other Ambulatory Visit: Payer: Self-pay | Admitting: Gastroenterology

## 2016-08-22 ENCOUNTER — Other Ambulatory Visit: Payer: Self-pay | Admitting: Gastroenterology

## 2017-05-04 IMAGING — MG MM DIAG BREAST W/IMPLANT BILATERAL
8 of 12 series · 8 of 28 positions shown · non-contrast
Comparison: Previous exam(s).

CLINICAL DATA: Follow-up for a probably benign left breast mass.

EXAM:
DIGITAL DIAGNOSTIC BILATERAL MAMMOGRAM WITH IMPLANTS, 3D
TOMOSYNTHESIS WITH CAD
LEFT BREAST ULTRASOUND
The patient has bilateral subpectoral saline implants. Standard and
implant displaced views were performed.

[L CC (1 of 2)]
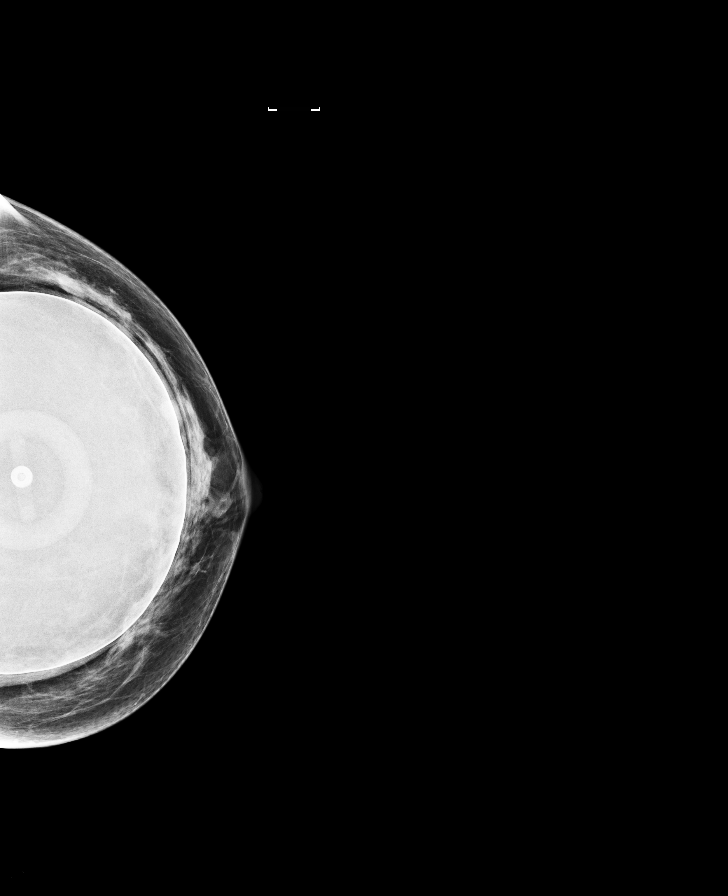

[R MLO (1 of 2)]
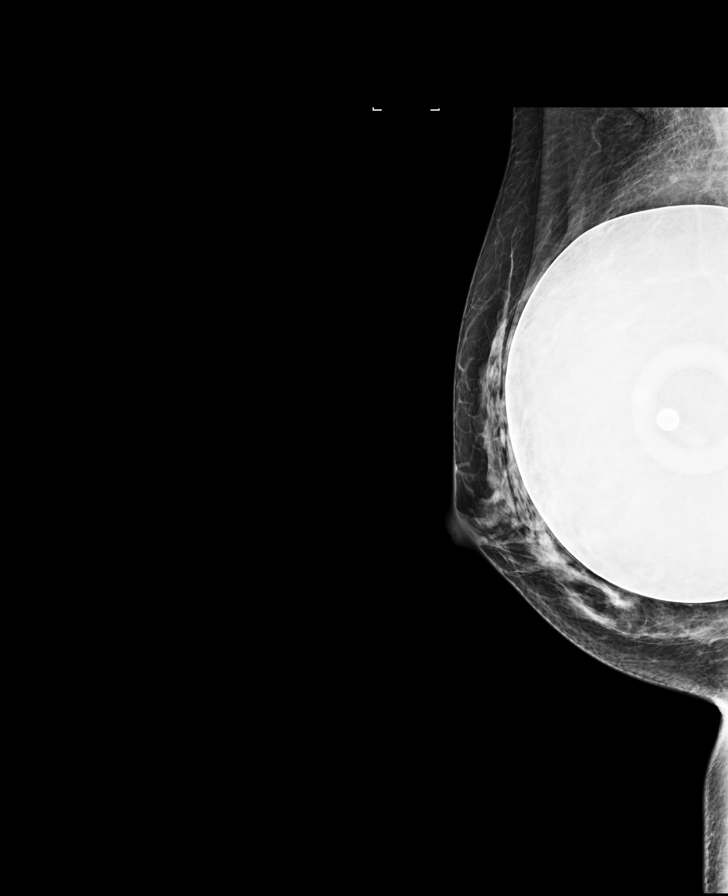

[R CC (1 of 2)]
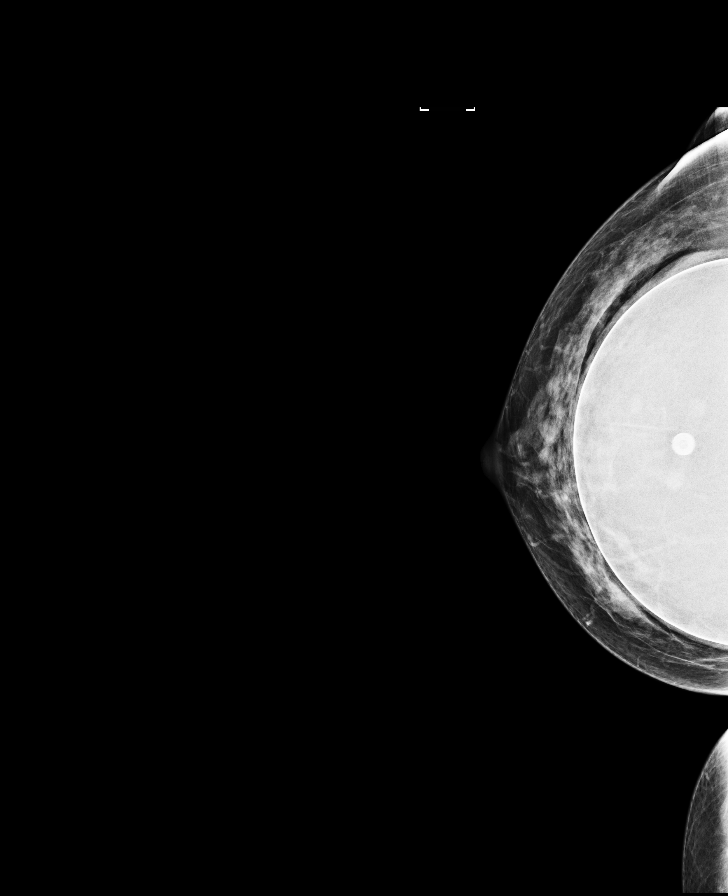

[L MLO (1 of 2)]
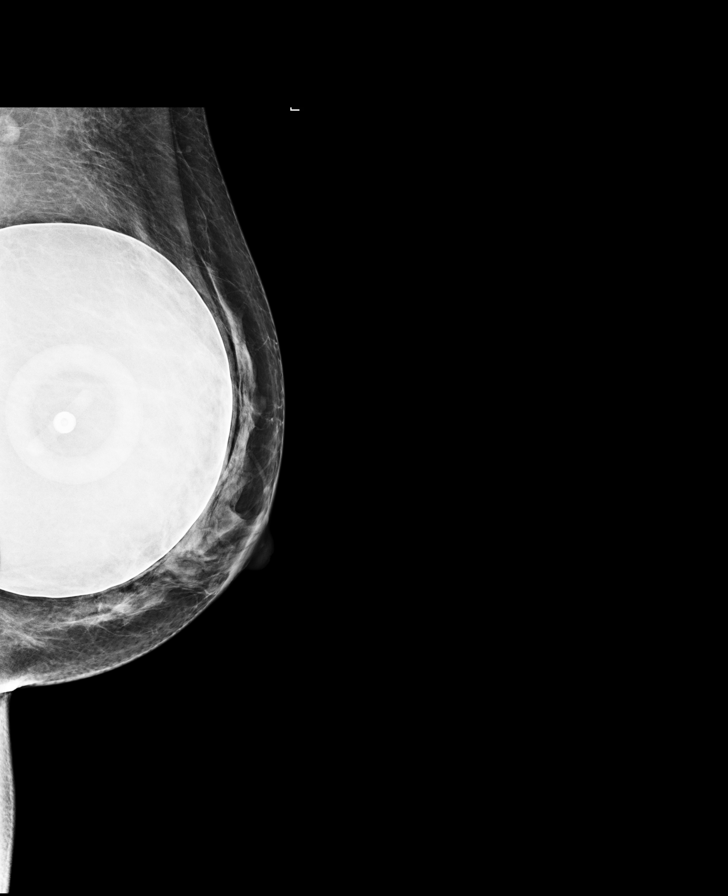

[R MLO (2 of 2)]
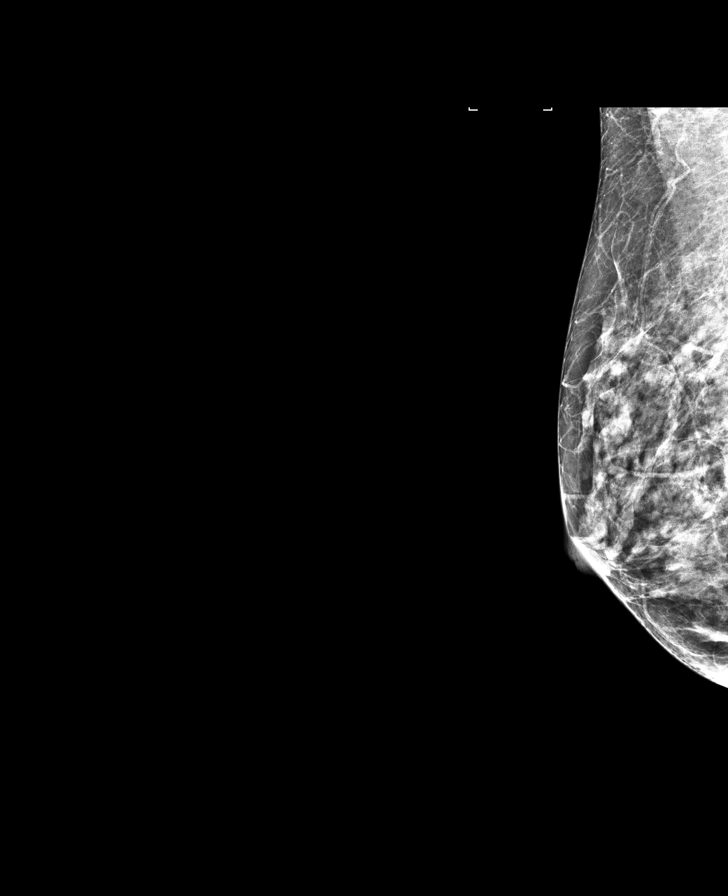

[L CC (2 of 2)]
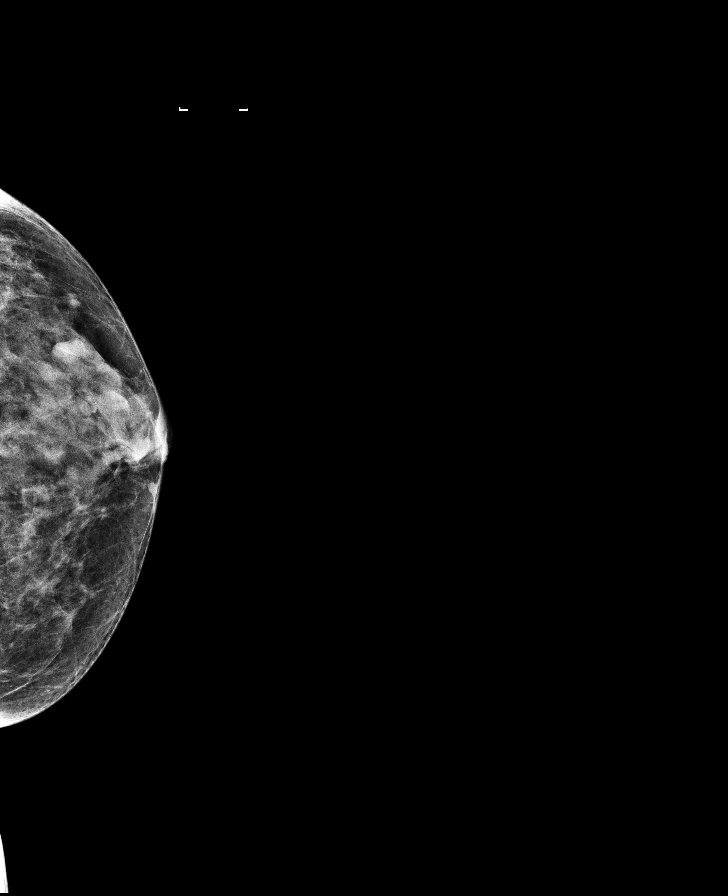

[R CC (2 of 2)]
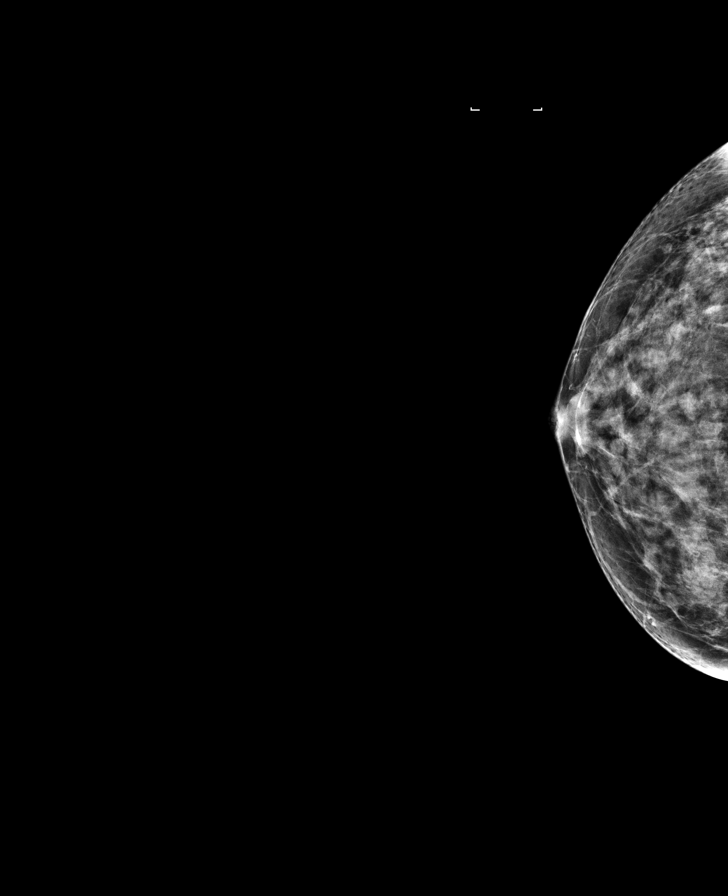

[L MLO (2 of 2)]
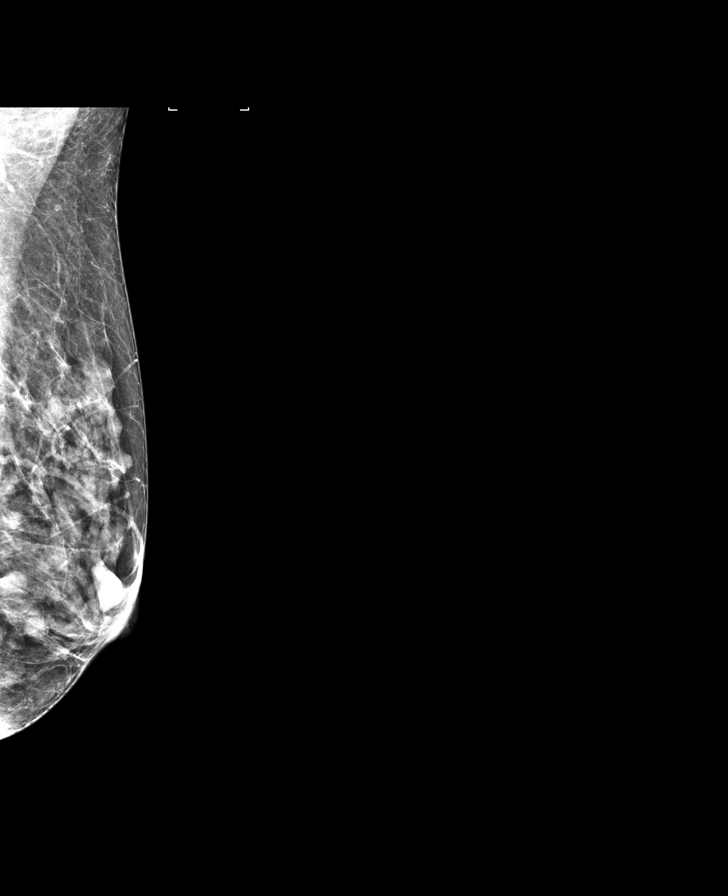

[8 of 28 positions shown; findings below may reference images not displayed]

ACR Breast Density Category d: The breast tissue is extremely dense,
which lowers the sensitivity of mammography.
FINDINGS: No suspicious masses or calcifications are seen in either breast.
The probably benign oval mass in the upper-outer left breast
measuring approximately 1.1 cm appears unchanged. There is no
mammographic evidence of malignancy in either breast.

Mammographic images were processed with CAD.

Physical examination of the upper-outer left breast does not reveal
any palpable masses.

Targeted ultrasound of the left breast was performed demonstrating
an oval well-circumscribed near anechoic mass with posterior
acoustic enhancement at 1 o'clock 3 cm from nipple compatible with a
cyst (likely septated cyst) measuring 0.5 x 0.3 x 0.4 cm. This is
similar when compared to the prior exam dated 07/22/2013. Several
additional cysts/clusters of cysts are seen in the upper-outer left
breast.
IMPRESSION: 1. Left breast cyst.

2. No mammographic evidence of malignancy in either breast.

RECOMMENDATION:
Screening mammogram in one year.(Code:5C-2-UWZ)

I have discussed the findings and recommendations with the patient.
Results were also provided in writing at the conclusion of the
visit. If applicable, a reminder letter will be sent to the patient
regarding the next appointment.

BI-RADS CATEGORY  2: Benign.

## 2017-11-04 ENCOUNTER — Other Ambulatory Visit: Payer: Self-pay | Admitting: Gynecology

## 2017-11-04 DIAGNOSIS — Z1231 Encounter for screening mammogram for malignant neoplasm of breast: Secondary | ICD-10-CM

## 2017-12-03 ENCOUNTER — Ambulatory Visit
Admission: RE | Admit: 2017-12-03 | Discharge: 2017-12-03 | Disposition: A | Discharge status: Home / Self Care | Payer: 59 | Source: Ambulatory Visit | Attending: Gynecology | Admitting: Gynecology

## 2017-12-03 DIAGNOSIS — Z1231 Encounter for screening mammogram for malignant neoplasm of breast: Secondary | ICD-10-CM

## 2019-09-20 ENCOUNTER — Encounter: Payer: Self-pay | Admitting: Gastroenterology

## 2019-09-29 ENCOUNTER — Encounter: Payer: Self-pay | Admitting: Gastroenterology

## 2019-10-18 ENCOUNTER — Other Ambulatory Visit: Payer: Self-pay

## 2019-10-18 ENCOUNTER — Ambulatory Visit (AMBULATORY_SURGERY_CENTER): Payer: Self-pay

## 2019-10-18 VITALS — Temp 97.1°F | Ht 65.0 in | Wt 131.0 lb

## 2019-10-18 DIAGNOSIS — Z01818 Encounter for other preprocedural examination: Secondary | ICD-10-CM

## 2019-10-18 DIAGNOSIS — Z86010 Personal history of colonic polyps: Secondary | ICD-10-CM

## 2019-10-18 DIAGNOSIS — Z800 Family history of malignant neoplasm of digestive organs: Secondary | ICD-10-CM

## 2019-10-18 MED ORDER — NA SULFATE-K SULFATE-MG SULF 17.5-3.13-1.6 GM/177ML PO SOLN
ORAL | 0 refills | Status: DC
Start: 2019-10-18 — End: 2020-03-09

## 2019-10-18 NOTE — Progress Notes (Signed)
Patient is here in-person for PV. Patient denies any allergies to eggs or soy. Patient denies any problems with anesthesia/sedation. Patient denies any oxygen use at home. Patient denies taking any diet/weight loss medications or blood thinners. Patient is not being treated for MRSA or C-diff. EMMI education assisgned to the patient for the procedure, this was explained and instructions given to patient. COVID-19 screening test is on 2/18, the pt is aware. Pt is aware that care partner will wait in the car during procedure; if they feel like they will be too hot or cold to wait in the car; they may wait in the 4 th floor lobby. Patient is aware to bring only one care partner. We want them to wear a mask (we do not have any that we can provide them), practice social distancing, and we will check their temperatures when they get here.  I did remind the patient that their care partner needs to stay in the parking lot the entire time and have a cell phone available, we will call them when the pt is ready for discharge. Patient will wear mask into building.    Suprep $15 off coupon given to the patient.

## 2019-10-26 ENCOUNTER — Encounter: Payer: 59 | Admitting: Gastroenterology

## 2019-10-27 ENCOUNTER — Other Ambulatory Visit: Payer: Self-pay | Admitting: Gastroenterology

## 2019-10-27 LAB — SARS CORONAVIRUS 2 (TAT 6-24 HRS): SARS Coronavirus 2: NEGATIVE

## 2019-10-28 ENCOUNTER — Encounter: Payer: Self-pay | Admitting: Gastroenterology

## 2019-10-31 ENCOUNTER — Telehealth: Payer: Self-pay | Admitting: Gastroenterology

## 2019-11-01 ENCOUNTER — Encounter: Payer: 59 | Admitting: Gastroenterology

## 2020-01-26 ENCOUNTER — Other Ambulatory Visit: Payer: Self-pay | Admitting: Family Medicine

## 2020-01-26 DIAGNOSIS — Z1231 Encounter for screening mammogram for malignant neoplasm of breast: Secondary | ICD-10-CM

## 2020-01-26 DIAGNOSIS — M8580 Other specified disorders of bone density and structure, unspecified site: Secondary | ICD-10-CM

## 2020-03-09 ENCOUNTER — Ambulatory Visit (INDEPENDENT_AMBULATORY_CARE_PROVIDER_SITE_OTHER): Payer: BC Managed Care – PPO | Admitting: Internal Medicine

## 2020-03-09 ENCOUNTER — Other Ambulatory Visit: Payer: Self-pay

## 2020-03-09 ENCOUNTER — Telehealth: Payer: Self-pay

## 2020-03-09 ENCOUNTER — Encounter: Payer: Self-pay | Admitting: Internal Medicine

## 2020-03-09 VITALS — BP 130/90 | HR 70 | Ht 65.0 in | Wt 130.2 lb

## 2020-03-09 DIAGNOSIS — I10 Essential (primary) hypertension: Secondary | ICD-10-CM | POA: Diagnosis not present

## 2020-03-09 DIAGNOSIS — R002 Palpitations: Secondary | ICD-10-CM

## 2020-03-09 NOTE — Telephone Encounter (Signed)
14 day ZIO XT registered to be mailed to pt's home address.

## 2020-03-09 NOTE — Progress Notes (Signed)
Cardiology Office Note   Date:  03/09/2020   ID:  Demiana Crumbley, DOB 1956/04/27, MRN [id number]  PCP:  [place] [place] [facility] [place]  Cardiologist:   Dorris Carnes, MD   Pt is self referred for palpitations     History of Present Illness: Doris Braun is a 64 y.o. female who presents for eval of palpitations   For past couple of years she has had intermitt fluttering in her chest  She describes as a flip flop and  then heart rate increases  Can happen several times per week   Flutter/flipping   Associated with "weird" feeling    Lips tingly   She denies dizziness   No CP  No syncope  She is otherwise very active   No limitation     Current Meds  Medication Sig  . estradiol (VIVELLE-DOT) 0.0375 MG/24HR Place 1 patch onto the skin 2 (two) times a week.  . Multiple Vitamin (MULTIVITAMIN) tablet Take 1 tablet by mouth daily.  Marland Kitchen omeprazole (PRILOSEC) 20 MG capsule Take 20 mg by mouth daily.  . progesterone (PROMETRIUM) 200 MG capsule TAKE ONE CAPSULE BY MOUTH ON DAYS 1 14 EVERY MONTH     Allergies:   Macrobid  [nitrofurantoin]   Past Medical History:  Diagnosis Date  . Abnormal vaginal bleeding   . Allergy   . 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 Fibroids   . Osteopenia   . Palpitations   . Postmenopausal     Past Surgical History:  Procedure Laterality Date  . AUGMENTATION MAMMAPLASTY Bilateral    age 41  . BREAST ENHANCEMENT SURGERY     1996  . COLONOSCOPY  2016  . POLYPECTOMY    . WISDOM TOOTH EXTRACTION       Social History:  The patient  reports that she has never smoked. She has never used smokeless tobacco. She reports that she does not drink alcohol and does not use drugs.   Family History:  The patient's family history includes Colon cancer in her maternal grandfather, paternal grandfather, paternal grandmother, and another family member; Colon cancer (age of onset: 71) in her father.    ROS:  Please see the history of present illness.  All other systems are reviewed and  Negative to the above problem except as noted.    PHYSICAL EXAM: VS:  BP 130/90   Pulse 70   Ht 5\' 5"  (1.651 m)   Wt 130 lb 3.2 oz (59.1 kg)   LMP 05/09/2011   SpO2 98%   BMI 21.67 kg/m   GEN: Well nourished, well developed, in no acute distress  HEENT: normal  Neck: no JVD, No carotid bruits Cardiac: RRR; no murmurs, rubs, or gallops,no LE edema  Respiratory:  clear to auscultation bilaterally, normal work of breathing GI: soft, nontender, nondistended, + BS  No hepatomegaly  MS: no deformity Moving all extremities   Skin: warm and dry, no rash Neuro:  Strength and sensation are intact Psych: euthymic mood, full affect   EKG:  EKG is ordered today.  SR 70 bpm     Lipid Panel No results found for: CHOL, TRIG, HDL, CHOLHDL, VLDL, LDLCALC, LDLDIRECT    Wt Readings from Last 3 Encounters:  03/09/20 130 lb 3.2 oz (59.1 kg)  10/18/19 131 lb (59.4 kg)  06/20/15 126 lb 4 oz (57.3 kg)      ASSESSMENT AND PLAN:  1   Palpitations    Intermitt  Do not sound  hemodynamically destabilizing   WOuld set up for Zio patch to document   Would also set up for echo to confirm LV function and also eval atrial size    2   Lipids   Last limits in May 2021:   LDL 105  HDL 62     Continue to watch diet    3  Blood pressure   Continue to follow   Current medicines are reviewed at length with the patient today.  The patient does not have concerns regarding medicines.  Signed, Dorris Carnes, MD  03/09/2020 11:00 AM    [facility] [address], [place], [place]  [address] Phone: [phone]; Fax: [phone]

## 2020-03-09 NOTE — Patient Instructions (Signed)
Medication Instructions:  The current medical regimen is effective;  continue present plan and medications.  *If you need a refill on your cardiac medications before your next appointment, please call your pharmacy*  Testing/Procedures: Your physician has requested that you have an echocardiogram. Echocardiography is a painless test that uses sound waves to create images of your heart. It provides your doctor with information about the size and shape of your heart and how well your heart's chambers and valves are working. This procedure takes approximately one hour. There are no restrictions for this procedure.  ZIO XT- Long Term Monitor Instructions   Your physician has requested you wear your ZIO patch monitor 14 days.   This is a single patch monitor.  [employer] supplies one patch monitor per enrollment.  Additional stickers are not available.   Please do not apply patch if you will be having a Nuclear Stress Test, Echocardiogram, Cardiac CT, MRI, or Chest Xray during the time frame you would be wearing the monitor. The patch cannot be worn during these tests.  You cannot remove and re-apply the ZIO XT patch monitor.   Your ZIO patch monitor will be sent USPS Priority mail from [facility] directly to your home address. The monitor may also be mailed to a PO BOX if home delivery is not available.   It may take 3-5 days to receive your monitor after you have been enrolled.   Once you have received you monitor, please review enclosed instructions.  Your monitor has already been registered assigning a specific monitor serial # to you.   Applying the monitor   Shave hair from upper left chest.   Hold abrader disc by orange tab.  Rub abrader in 40 strokes over left upper chest as indicated in your monitor instructions.   Clean area with 4 enclosed alcohol pads .  Use all pads to assure are is cleaned thoroughly.  Let dry.   Apply patch as indicated in monitor instructions.  Patch  will be place under collarbone on left side of chest with arrow pointing upward.   Rub patch adhesive wings for 2 minutes.Remove white label marked "1".  Remove white label marked "2".  Rub patch adhesive wings for 2 additional minutes.   While looking in a mirror, press and release button in center of patch.  A small green light will flash 3-4 times .  This will be your only indicator the monitor has been turned on.     Do not shower for the first 24 hours.  You may shower after the first 24 hours.   Press button if you feel a symptom. You will hear a small click.  Record Date, Time and Symptom in the Patient Log Book.   When you are ready to remove patch, follow instructions on last 2 pages of Patient Log Book.  Stick patch monitor onto last page of Patient Log Book.   Place Patient Log Book in [place] box.  Use locking tab on box and tape box closed securely.  The [employer] and [employer] has [employer] on it.  Please place in mailbox as soon as possible.  Your physician should have your test results approximately 7 days after the monitor has been mailed back to [facility].   Call [facility] at [phone] if you have questions regarding your ZIO XT patch monitor.  Call them immediately if you see an orange light blinking on your monitor.   If your monitor falls off in  less than 4 days contact our Monitor department at [phone].  If your monitor becomes loose or falls off after 4 days call [employer] at [phone] for suggestions on securing your monitor.   Follow-Up: At [facility], you and your health needs are our priority.  As part of our continuing mission to provide you with exceptional heart care, we have created designated Provider Care Teams.  These Care Teams include your primary Cardiologist (physician) and Advanced Practice Providers (APPs -  Physician Assistants and Nurse Practitioners) who all work together to provide you with the care you need, when  you need it.  We recommend signing up for the patient portal called "MyChart".  Sign up information is provided on this After Visit Summary.  MyChart is used to connect with patients for Virtual Visits (Telemedicine).  Patients are able to view lab/test results, encounter notes, upcoming appointments, etc.  Non-urgent messages can be sent to your provider as well.   To learn more about what you can do with MyChart, go to [email or web address].    Follow up to be determined after the above testing.  Thank you for choosing [facility]!!

## 2020-04-02 ENCOUNTER — Other Ambulatory Visit: Payer: Self-pay

## 2020-04-02 ENCOUNTER — Ambulatory Visit (HOSPITAL_COMMUNITY): Payer: BC Managed Care – PPO | Attending: Internal Medicine

## 2020-04-02 DIAGNOSIS — R002 Palpitations: Secondary | ICD-10-CM | POA: Insufficient documentation

## 2020-04-02 DIAGNOSIS — I10 Essential (primary) hypertension: Secondary | ICD-10-CM | POA: Insufficient documentation

## 2020-04-02 LAB — ECHOCARDIOGRAM COMPLETE
Area-P 1/2: 3.79 cm2
S' Lateral: 2.7 cm

## 2020-04-09 ENCOUNTER — Other Ambulatory Visit (INDEPENDENT_AMBULATORY_CARE_PROVIDER_SITE_OTHER): Payer: BC Managed Care – PPO

## 2020-04-09 DIAGNOSIS — R002 Palpitations: Secondary | ICD-10-CM

## 2020-10-17 ENCOUNTER — Ambulatory Visit: Payer: BC Managed Care – PPO | Admitting: Gastroenterology

## 2020-11-08 ENCOUNTER — Ambulatory Visit (INDEPENDENT_AMBULATORY_CARE_PROVIDER_SITE_OTHER): Payer: Medicare Other | Admitting: Physician Assistant

## 2020-11-08 ENCOUNTER — Other Ambulatory Visit: Payer: Self-pay

## 2020-11-08 ENCOUNTER — Encounter: Payer: Self-pay | Admitting: Physician Assistant

## 2020-11-08 VITALS — BP 102/68 | HR 92 | Ht 65.0 in | Wt 123.2 lb

## 2020-11-08 DIAGNOSIS — Z800 Family history of malignant neoplasm of digestive organs: Secondary | ICD-10-CM | POA: Diagnosis not present

## 2020-11-08 DIAGNOSIS — R140 Abdominal distension (gaseous): Secondary | ICD-10-CM

## 2020-11-08 DIAGNOSIS — K219 Gastro-esophageal reflux disease without esophagitis: Secondary | ICD-10-CM

## 2020-11-08 DIAGNOSIS — R630 Anorexia: Secondary | ICD-10-CM

## 2020-11-08 MED ORDER — NA SULFATE-K SULFATE-MG SULF 17.5-3.13-1.6 GM/177ML PO SOLN
1.0000 | ORAL | 0 refills | Status: DC
Start: 2020-11-08 — End: 2020-12-10

## 2020-11-08 MED ORDER — PANTOPRAZOLE SODIUM 40 MG PO TBEC
40.0000 mg | DELAYED_RELEASE_TABLET | Freq: Every day | ORAL | 5 refills | Status: DC
Start: 2020-11-08 — End: 2021-04-29

## 2020-11-08 NOTE — Progress Notes (Signed)
Chief Complaint: Reflux, burping and bloating with a decreased appetite as well as a family history of colon cancer  HPI:    Doris Braun is a 65 year old Caucasian female with a past medical history as listed below, known to Dr. Fuller Plan, who was referred to me by Marda Stalker, PA-C for a complaint of reflux, burping and bloating with a decreased appetite and family history of colon cancer.      10/13/2014 colonoscopy done for a family history of colon cancer with mild diverticulosis and otherwise normal.  Repeat recommended in 5 years.    Today, the patient tells me that in December she started with a lot of burping which is "not like me", and describes reflux 1 bending over as well as a decrease in appetite and a general "ill feeling" in her upper abdomen.  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  She has tried over-the-counter Pepcid 10 mg which she is still taking twice a day as well as Pepto-Bismol and Omeprazole 20 mg once daily which really did not change anything.    Her husband works as a Animal nutritionist.    Denies fever, chills, change in bowel habits or symptoms that awaken her from sleep.  Past Medical History:  Diagnosis Date  . Abnormal vaginal bleeding   . Allergy   . 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 Fibroids   . Osteopenia   . Palpitations   . Postmenopausal     Past Surgical History:  Procedure Laterality Date  . AUGMENTATION MAMMAPLASTY Bilateral    age 18  . BREAST ENHANCEMENT SURGERY     1996  . COLONOSCOPY  2016  . POLYPECTOMY    . WISDOM TOOTH EXTRACTION      Current Outpatient Medications  Medication Sig Dispense Refill  . estradiol (VIVELLE-DOT) 0.0375 MG/24HR Place 1 patch onto the skin 2 (two) times a week.    . famotidine (PEPCID) 10 MG tablet Take 10 mg by mouth 2 (two) times daily.    . Multiple Vitamin (MULTIVITAMIN) tablet Take 1  tablet by mouth daily.    . progesterone (PROMETRIUM) 200 MG capsule TAKE ONE CAPSULE BY MOUTH ON DAYS 1 14 EVERY MONTH     No current facility-administered medications for this visit.    Allergies as of 11/08/2020 - Review Complete 11/08/2020  Allergen Reaction Noted  . Macrobid [nitrofurantoin] Other (See Comments) 10/05/2019    Family History  Problem Relation Age of Onset  . Colon cancer Father 59  . Colon cancer Maternal Grandfather        ?  . Colon cancer Paternal Grandmother   . Colon cancer Paternal Grandfather   . Colon cancer Other        ? paternal uncle  . Esophageal cancer Neg Hx   . Rectal cancer Neg Hx   . Stomach cancer Neg Hx     Social History   Socioeconomic History  . Marital status: Married    Spouse name: Not on file  . Number of children: Not on file  . Years of education: Not on file  . Highest education level: Not on file  Occupational History  . Not on file  Tobacco Use  . Smoking status: Never Smoker  . Smokeless tobacco: Never Used  Vaping Use  . Vaping Use: Never used  Substance and Sexual Activity  .  Alcohol use: No    Alcohol/week: 0.0 standard drinks  . Drug use: No  . Sexual activity: Not on file  Other Topics Concern  . Not on file  Social History Narrative  . Not on file   Social Determinants of Health   Financial Resource Strain: Not on file  Food Insecurity: Not on file  Transportation Needs: Not on file  Physical Activity: Not on file  Stress: Not on file  Social Connections: Not on file  Intimate Partner Violence: Not on file    Review of Systems:    Constitutional: No weight loss, fever or chills Skin: No rash  Cardiovascular: No chest pain  Respiratory: No SOB Gastrointestinal: See HPI and otherwise negative Genitourinary: No dysuria  Neurological: No headache, dizziness or syncope Musculoskeletal: No new muscle or joint pain Hematologic: No bleeding  Psychiatric: No history of depression or anxiety    Physical Exam:  Vital signs: BP 102/68 (BP Location: Left Arm, Patient Position: Sitting, Cuff Size: Normal)   Pulse 92   Ht 5\' 5"  (1.651 m)   Wt 123 lb 4 oz (55.9 kg)   LMP 05/09/2011   BMI 20.51 kg/m   Constitutional:   Pleasant Caucasian female appears to be in NAD, Well developed, Well nourished, alert and cooperative Head:  Normocephalic and atraumatic. Eyes:   PEERL, EOMI. No icterus. Conjunctiva pink. Ears:  Normal auditory acuity. Neck:  Supple Throat: Oral cavity and pharynx without inflammation, swelling or lesion.  Respiratory: Respirations even and unlabored. Lungs clear to auscultation bilaterally.   No wheezes, crackles, or rhonchi.  Cardiovascular: Normal S1, S2. No MRG. Regular rate and rhythm. No peripheral edema, cyanosis or pallor.  Gastrointestinal:  Soft, nondistended, nontender. No rebound or guarding. Normal bowel sounds. No appreciable masses or hepatomegaly. Rectal:  Not performed.  Msk:  Symmetrical without gross deformities. Without edema, no deformity or joint abnormality.  Neurologic:  Alert and  oriented x4;  grossly normal neurologically.  Skin:   Dry and intact without significant lesions or rashes. Psychiatric: Demonstrates good judgement and reason without abnormal affect or behaviors.  No recent labs or imaging.  Assessment: 1.  GERD: When patient bends over, worsened after patient was on ibuprofen for TMJ for a few months; consider gastritis+/-PUD versus other 2.  Bloating/burping: With above 3.  Decrease in appetite: With above 4.  Family history of colon cancer: Last colonoscopy 10/13/2014 with recommendations to repeat in 5 years  Plan: 1.  Prescribed Pantoprazole 40 mg daily, 30-60 minutes before breakfast #30 with 5 refills. 2.  Scheduled the patient for diagnostic EGD and surveillance colonoscopy in the [place] with Dr. Fuller Plan.  Did provide the patient with a detailed list of risks for the procedure and she agrees to proceed. 3.  Patient to  follow in clinic per recommendations from Dr. Fuller Plan after time of procedure.  Ellouise Newer, PA-C [facility] 11/08/2020, 8:33 AM  Cc: Marda Stalker, PA-C

## 2020-11-08 NOTE — Patient Instructions (Signed)
You have been scheduled for an endoscopy and colonoscopy. Please follow the written instructions given to you at your visit today. Please pick up your prep supplies at the pharmacy within the next 1-3 days. If you use inhalers (even only as needed), please bring them with you on the day of your procedure.  Due to recent changes in healthcare laws, you may see the results of your imaging and laboratory studies on MyChart before your provider has had a chance to review them.  We understand that in some cases there may be results that are confusing or concerning to you. Not all laboratory results come back in the same time frame and the provider may be waiting for multiple results in order to interpret others.  Please give [place] 48 hours in order for your provider to thoroughly review all the results before contacting the office for clarification of your results.   We have sent the following medications to your pharmacy for you to pick up at your convenience: Pantoprazole and suprep  I appreciate the opportunity to care for you. Ellouise Newer, PA-C

## 2020-11-08 NOTE — Progress Notes (Signed)
Reviewed and agree with management plan.  Malcolm T. Stark, MD FACG [phone]  

## 2020-12-10 ENCOUNTER — Encounter: Payer: Self-pay | Admitting: Gastroenterology

## 2020-12-10 ENCOUNTER — Other Ambulatory Visit: Payer: Self-pay

## 2020-12-10 ENCOUNTER — Ambulatory Visit (AMBULATORY_SURGERY_CENTER): Payer: Medicare Other | Admitting: Gastroenterology

## 2020-12-10 VITALS — BP 97/73 | HR 94 | Temp 95.9°F | Resp 18 | Ht 65.0 in | Wt 123.0 lb

## 2020-12-10 DIAGNOSIS — D175 Benign lipomatous neoplasm of intra-abdominal organs: Secondary | ICD-10-CM | POA: Diagnosis not present

## 2020-12-10 DIAGNOSIS — K219 Gastro-esophageal reflux disease without esophagitis: Secondary | ICD-10-CM | POA: Diagnosis not present

## 2020-12-10 DIAGNOSIS — Z1211 Encounter for screening for malignant neoplasm of colon: Secondary | ICD-10-CM | POA: Diagnosis not present

## 2020-12-10 DIAGNOSIS — K2970 Gastritis, unspecified, without bleeding: Secondary | ICD-10-CM | POA: Diagnosis not present

## 2020-12-10 DIAGNOSIS — K317 Polyp of stomach and duodenum: Secondary | ICD-10-CM

## 2020-12-10 DIAGNOSIS — Z800 Family history of malignant neoplasm of digestive organs: Secondary | ICD-10-CM

## 2020-12-10 DIAGNOSIS — K2950 Unspecified chronic gastritis without bleeding: Secondary | ICD-10-CM

## 2020-12-10 MED ORDER — SODIUM CHLORIDE 0.9 % IV SOLN: 500.0000 mL | Freq: Once | INTRAVENOUS | Status: DC

## 2020-12-10 NOTE — Progress Notes (Signed)
Called to room to assist during endoscopic procedure.  Patient ID and intended procedure confirmed with present staff. Received instructions for my participation in the procedure from the performing physician.  

## 2020-12-10 NOTE — Op Note (Signed)
[facility] Patient Name: Doris Braun Procedure Date: 12/10/2020 2:38 PM MRN: [id number] Endoscopist: Ladene Artist , MD Age: 65 Referring MD:  Date of Birth: June 17, 1956 Gender: Female Account #: [id number] Procedure:                Upper GI endoscopy Indications:              Gastroesophageal reflux disease Medicines:                Monitored Anesthesia Care Procedure:                Pre-Anesthesia Assessment:                           - Prior to the procedure, a History and Physical                            was performed, and patient medications and                            allergies were reviewed. The patient's tolerance of                            previous anesthesia was also reviewed. The risks                            and benefits of the procedure and the sedation                            options and risks were discussed with the patient.                            All questions were answered, and informed consent                            was obtained. Prior Anticoagulants: The patient has                            taken no previous anticoagulant or antiplatelet                            agents. ASA Grade Assessment: II - A patient with                            mild systemic disease. After reviewing the risks                            and benefits, the patient was deemed in                            satisfactory condition to undergo the procedure.                           After obtaining informed consent, the endoscope was  passed under direct vision. Throughout the                            procedure, the patient's blood pressure, pulse, and                            oxygen saturations were monitored continuously. The                            Endoscope was introduced through the mouth, and                            advanced to the second part of duodenum. The upper                            GI endoscopy was  accomplished without difficulty.                            The patient tolerated the procedure well. Scope In: Scope Out: Findings:                 The examined esophagus was normal.                           Patchy mildly erythematous mucosa without bleeding                            was found in the gastric body and in the gastric                            antrum. Biopsies were taken with a cold forceps for                            histology.                           Multiple 3 to 5 mm sessile polyps with no bleeding                            and no stigmata of recent bleeding were found in                            the gastric fundus. Biopsies were taken with a cold                            forceps for histology.                           The exam of the stomach was otherwise normal.                           The duodenal bulb and second portion of the  duodenum were normal. Complications:            No immediate complications. Estimated Blood Loss:     Estimated blood loss was minimal. Impression:               - Normal esophagus.                           - Erythematous mucosa in the gastric body and                            antrum. Biopsied.                           - Multiple gastric polyps. Biopsied.                           - Normal duodenal bulb and second portion of the                            duodenum. Recommendation:           - Patient has a contact number available for                            emergencies. The signs and symptoms of potential                            delayed complications were discussed with the                            patient. Return to normal activities tomorrow.                            Written discharge instructions were provided to the                            patient.                           - Resume previous diet.                           - Follow antireflux measures.                            - Continue present medications.                           - Await pathology results. Ladene Artist, MD 12/10/2020 3:19:11 PM This report has been signed electronically.

## 2020-12-10 NOTE — Progress Notes (Signed)
VS taken by [place] 

## 2020-12-10 NOTE — Patient Instructions (Signed)
Next colonoscopy 5 years  Await pathology  Please read handouts of diverticulosis and gastritis  Continue your normal medications  YOU HAD AN ENDOSCOPIC PROCEDURE TODAY AT [facility]:   Refer to the procedure report that was given to you for any specific questions about what was found during the examination.  If the procedure report does not answer your questions, please call your gastroenterologist to clarify.  If you requested that your care partner not be given the details of your procedure findings, then the procedure report has been included in a sealed envelope for you to review at your convenience later.  YOU SHOULD EXPECT: Some feelings of bloating in the abdomen. Passage of more gas than usual.  Walking can help get rid of the air that was put into your GI tract during the procedure and reduce the bloating. If you had a lower endoscopy (such as a colonoscopy or flexible sigmoidoscopy) you may notice spotting of blood in your stool or on the toilet paper. If you underwent a bowel prep for your procedure, you may not have a normal bowel movement for a few days.  Please Note:  You might notice some irritation and congestion in your nose or some drainage.  This is from the oxygen used during your procedure.  There is no need for concern and it should clear up in a day or so.  SYMPTOMS TO REPORT IMMEDIATELY:   Following lower endoscopy (colonoscopy or flexible sigmoidoscopy):  Excessive amounts of blood in the stool  Significant tenderness or worsening of abdominal pains  Swelling of the abdomen that is new, acute  Fever of 100F or higher   Following upper endoscopy (EGD)  Vomiting of blood or coffee ground material  New chest pain or pain under the shoulder blades  Painful or persistently difficult swallowing  New shortness of breath  Fever of 100F or higher  Black, tarry-looking stools  For urgent or emergent issues, a gastroenterologist can be reached at any  hour by calling [phone]. Do not use MyChart messaging for urgent concerns.    DIET:  We do recommend a small meal at first, but then you may proceed to your regular diet.  Drink plenty of fluids but you should avoid alcoholic beverages for 24 hours.  ACTIVITY:  You should plan to take it easy for the rest of today and you should NOT DRIVE or use heavy machinery until tomorrow (because of the sedation medicines used during the test).    FOLLOW UP: Our staff will call the number listed on your records 48-72 hours following your procedure to check on you and address any questions or concerns that you may have regarding the information given to you following your procedure. If we do not reach you, we will leave a message.  We will attempt to reach you two times.  During this call, we will ask if you have developed any symptoms of COVID 19. If you develop any symptoms (ie: fever, flu-like symptoms, shortness of breath, cough etc.) before then, please call [phone].  If you test positive for Covid 19 in the 2 weeks post procedure, please call and report this information to [place].    If any biopsies were taken you will be contacted by phone or by letter within the next 1-3 weeks.  Please call us at [phone] if you have not heard about the biopsies in 3 weeks.    SIGNATURES/CONFIDENTIALITY: You and/or your care partner have signed paperwork which  will be entered into your electronic medical record.  These signatures attest to the fact that that the information above on your After Visit Summary has been reviewed and is understood.  Full responsibility of the confidentiality of this discharge information lies with you and/or your care-partner.

## 2020-12-10 NOTE — Progress Notes (Signed)
A and O x3. Report to RN. Tolerated MAC anesthesia well.Teeth unchanged after procedure.

## 2020-12-10 NOTE — Op Note (Signed)
[facility] Patient Name: Doris Braun Procedure Date: 12/10/2020 2:38 PM MRN: [id number] Endoscopist: Ladene Artist , MD Age: 65 Referring MD:  Date of Birth: [birth year] Gender: Female Account #: [id number] Procedure:                Colonoscopy Indications:              Screening in patient at increased risk: Family                            history of 1st-degree relative with colorectal                            cancer and multiple 2nd degree relatives with colon                            cancer. Medicines:                Monitored Anesthesia Care Procedure:                Pre-Anesthesia Assessment:                           - Prior to the procedure, a History and Physical                            was performed, and patient medications and                            allergies were reviewed. The patient's tolerance of                            previous anesthesia was also reviewed. The risks                            and benefits of the procedure and the sedation                            options and risks were discussed with the patient.                            All questions were answered, and informed consent                            was obtained. Prior Anticoagulants: The patient has                            taken no previous anticoagulant or antiplatelet                            agents. ASA Grade Assessment: II - A patient with                            mild systemic disease. After reviewing the risks  and benefits, the patient was deemed in                            satisfactory condition to undergo the procedure.                           After obtaining informed consent, the colonoscope                            was passed under direct vision. Throughout the                            procedure, the patient's blood pressure, pulse, and                            oxygen saturations were monitored continuously. The                             Olympus PCF-H190DL (#[id number]) Colonoscope was                            introduced through the anus and advanced to the the                            cecum, identified by appendiceal orifice and                            ileocecal valve. The ileocecal valve, appendiceal                            orifice, and rectum were photographed. The quality                            of the bowel preparation was good. The colonoscopy                            was performed without difficulty. The patient                            tolerated the procedure well. Scope In: 2:45:37 PM Scope Out: 3:02:59 PM Scope Withdrawal Time: 0 hours 14 minutes 26 seconds  Total Procedure Duration: 0 hours 17 minutes 22 seconds  Findings:                 The perianal and digital rectal examinations were                            normal.                           There was a medium-sized lipoma, 15 mm in diameter,                            at the hepatic flexure. Biopsies were taken with a  cold forceps for histology.                           A few medium-mouthed diverticula were found in the                            left colon. There was no evidence of diverticular                            bleeding.                           The exam was otherwise without abnormality on                            direct and retroflexion views. Complications:            No immediate complications. Estimated blood loss:                            None. Estimated Blood Loss:     Estimated blood loss: none. Impression:               - Medium-sized lipoma at the hepatic flexure.                            Biopsied.                           - Mild diverticulosis in the left colon.                           - The examination was otherwise normal on direct                            and retroflexion views. Recommendation:           - Repeat colonoscopy in 5 years for screening                             purposes.                           - Patient has a contact number available for                            emergencies. The signs and symptoms of potential                            delayed complications were discussed with the                            patient. Return to normal activities tomorrow.                            Written discharge instructions were provided to the  patient.                           - Resume previous diet.                           - Continue present medications.                           - Await pathology results. Ladene Artist, MD 12/10/2020 3:16:13 PM This report has been signed electronically.

## 2020-12-12 ENCOUNTER — Telehealth: Payer: Self-pay

## 2020-12-12 NOTE — Telephone Encounter (Signed)
  Follow up Call-  Call back number 12/10/2020  Post procedure Call Back phone  # [phone]  Permission to leave phone message Yes  Some recent data might be hidden     Patient questions:  Do you have a fever, pain , or abdominal swelling? No. Pain Score  0 *  Have you tolerated food without any problems? Yes.    Have you been able to return to your normal activities? Yes.    Do you have any questions about your discharge instructions: Diet   No. Medications  No. Follow up visit  No.  Do you have questions or concerns about your Care? No.  Actions: * If pain score is 4 or above: No action needed, pain <4.

## 2020-12-18 ENCOUNTER — Encounter: Payer: Self-pay | Admitting: Gastroenterology

## 2020-12-27 ENCOUNTER — Other Ambulatory Visit: Payer: Self-pay | Admitting: Gynecology

## 2020-12-27 DIAGNOSIS — Z1231 Encounter for screening mammogram for malignant neoplasm of breast: Secondary | ICD-10-CM

## 2021-01-31 DIAGNOSIS — Z1231 Encounter for screening mammogram for malignant neoplasm of breast: Secondary | ICD-10-CM

## 2021-04-11 ENCOUNTER — Ambulatory Visit: Payer: Medicare Other | Admitting: Gastroenterology

## 2021-04-28 ENCOUNTER — Other Ambulatory Visit: Payer: Self-pay | Admitting: Physician Assistant

## 2021-05-31 ENCOUNTER — Ambulatory Visit: Payer: Medicare Other | Admitting: Gastroenterology
